# Patient Record
Sex: Male | Born: 1990 | Race: Black or African American | Hispanic: No | Marital: Single | Smoking: Never smoker
Health system: Southern US, Community
[De-identification: ages and names within clinical notes are randomized; demographics above are authoritative.]

## PROBLEM LIST (undated history)

## (undated) HISTORY — PX: OTHER SURGICAL HISTORY: SHX169

---
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
## 2012-06-06 ENCOUNTER — Encounter (HOSPITAL_COMMUNITY): Payer: Self-pay | Admitting: *Deleted

## 2012-06-06 ENCOUNTER — Emergency Department (HOSPITAL_COMMUNITY)
Admission: EM | Admit: 2012-06-06 | Discharge: 2012-06-06 | Discharge status: Absent without leave | Payer: Self-pay | Source: Home / Self Care

## 2012-06-06 ENCOUNTER — Emergency Department (HOSPITAL_COMMUNITY)
Admission: EM | Admit: 2012-06-06 | Discharge: 2012-06-06 | Disposition: A | Discharge status: Home / Self Care | Payer: Self-pay | Attending: Emergency Medicine | Admitting: Emergency Medicine

## 2012-06-06 DIAGNOSIS — R110 Nausea: Secondary | ICD-10-CM | POA: Insufficient documentation

## 2012-06-06 DIAGNOSIS — R5383 Other fatigue: Secondary | ICD-10-CM | POA: Insufficient documentation

## 2012-06-06 DIAGNOSIS — R5381 Other malaise: Secondary | ICD-10-CM | POA: Insufficient documentation

## 2012-06-06 DIAGNOSIS — R42 Dizziness and giddiness: Secondary | ICD-10-CM | POA: Insufficient documentation

## 2012-06-06 DIAGNOSIS — R197 Diarrhea, unspecified: Secondary | ICD-10-CM | POA: Insufficient documentation

## 2012-06-06 LAB — URINALYSIS, MICROSCOPIC ONLY
Leukocytes, UA: NEGATIVE
Protein, ur: 30 mg/dL — AB

## 2012-06-06 LAB — COMPREHENSIVE METABOLIC PANEL
AST: 25 U/L (ref 0–37)
Albumin: 4.1 g/dL (ref 3.5–5.2)
Alkaline Phosphatase: 102 U/L (ref 39–117)
CO2: 27 mEq/L (ref 19–32)
Calcium: 9.8 mg/dL (ref 8.4–10.5)
Creatinine, Ser: 1.01 mg/dL (ref 0.50–1.35)
Total Protein: 8.3 g/dL (ref 6.0–8.3)

## 2012-06-06 LAB — CBC WITH DIFFERENTIAL/PLATELET
Basophils Absolute: 0 10*3/uL (ref 0.0–0.1)
HCT: 45.6 % (ref 39.0–52.0)
Hemoglobin: 15.5 g/dL (ref 13.0–17.0)
Lymphs Abs: 1.5 10*3/uL (ref 0.7–4.0)
MCH: 27 pg (ref 26.0–34.0)
Monocytes Relative: 8 % (ref 3–12)
Platelets: 226 10*3/uL (ref 150–400)
RBC: 5.75 MIL/uL (ref 4.22–5.81)
RDW: 14.2 % (ref 11.5–15.5)

## 2012-06-06 MED ORDER — SODIUM CHLORIDE 0.9 % IV BOLUS (SEPSIS)
1000.0000 mL | Freq: Once | INTRAVENOUS | Status: AC
  Administered 2012-06-06: 1000 mL via INTRAVENOUS

## 2012-06-06 MED ORDER — DIPHENOXYLATE-ATROPINE 2.5-0.025 MG PO TABS
1.0000 | ORAL_TABLET | Freq: Once | ORAL | Status: AC
  Administered 2012-06-06: 1 via ORAL
  Filled 2012-06-06: qty 1

## 2012-06-06 MED ORDER — DIPHENOXYLATE-ATROPINE 2.5-0.025 MG PO TABS: 1.0000 | ORAL_TABLET | Freq: Four times a day (QID) | ORAL | Status: DC | PRN

## 2012-06-06 MED ORDER — ONDANSETRON HCL 4 MG PO TABS: 4.0000 mg | ORAL_TABLET | Freq: Four times a day (QID) | ORAL | Status: DC

## 2012-06-06 MED ORDER — DICYCLOMINE HCL 20 MG PO TABS: 20.0000 mg | ORAL_TABLET | Freq: Two times a day (BID) | ORAL | Status: DC

## 2012-06-06 MED ORDER — SODIUM CHLORIDE 0.9 % IV SOLN
Freq: Once | INTRAVENOUS | Status: AC
  Administered 2012-06-06: 15:00:00 via INTRAVENOUS

## 2012-06-06 NOTE — ED Notes (Signed)
Reports lower abd pain with diarrhea since Tuesday. Denies urinary symptoms or vomiting. No acute distress noted at triage.

## 2012-06-06 NOTE — ED Provider Notes (Signed)
Medical screening examination/treatment/procedure(s) were performed by non-physician practitioner and as supervising physician I was immediately available for consultation/collaboration.  Ethelda Chick, MD 06/06/12 Ernestina [place]

## 2012-06-06 NOTE — ED Provider Notes (Signed)
History     CSN: [id number]  Arrival date & time 06/06/12  1222   First MD Initiated Contact with Patient 06/06/12 1428      Chief Complaint  Patient presents with  . Abdominal Pain    (Consider location/radiation/quality/duration/timing/severity/associated sxs/prior treatment) Patient is a 21 y.o. male presenting with abdominal pain. The history is provided by the patient. No language interpreter was used.  Abdominal Pain The primary symptoms of the illness include abdominal pain, fatigue, nausea and diarrhea. The primary symptoms of the illness do not include fever, shortness of breath, vomiting or dysuria. The current episode started more than 2 days ago. The onset of the illness was gradual.  The patient has had a change in bowel habit. Symptoms associated with the illness do not include diaphoresis, constipation or back pain.   21 yo male with c/o diarrhea x 4-5 days.  15 times in the last 12 hours.  Dizzy upon standing x 1 last pm.  No antibiotics in the last 3 months.  Diffuse abdominal pain.  Will hydrate in the ER and give lomotil.  Denies fever, n/v.  Able to eat and drink.  States he felt better last night after he drank a large container of water.     History reviewed. No pertinent past medical history.  History reviewed. No pertinent past surgical history.  History reviewed. No pertinent family history.  History  Substance Use Topics  . Smoking status: Never Smoker   . Smokeless tobacco: Not on file  . Alcohol Use: Yes     Comment: occ      Review of Systems  Constitutional: Positive for fatigue. Negative for fever and diaphoresis.  HENT: Negative.   Eyes: Negative.   Respiratory: Negative.  Negative for shortness of breath.   Cardiovascular: Negative.   Gastrointestinal: Positive for nausea, abdominal pain and diarrhea. Negative for vomiting and constipation.  Genitourinary: Negative for dysuria.  Musculoskeletal: Negative for back pain.  Neurological:  Positive for light-headedness. Negative for dizziness, facial asymmetry and headaches.  Psychiatric/Behavioral: Negative.   All other systems reviewed and are negative.    Allergies  Review of patient's allergies indicates no known allergies.  Home Medications   Current Outpatient Rx  Name  Route  Sig  Dispense  Refill  . AMPHETAMINE-DEXTROAMPHET ER 10 MG PO CP24   Oral   Take 2 capsules (20 mg total) by mouth every morning. Fill now   60 capsule   0   . AMPHETAMINE-DEXTROAMPHET ER 10 MG PO CP24   Oral   Take 2 capsules (20 mg total) by mouth daily. Fill in one month   60 capsule   0   . AMPHETAMINE-DEXTROAMPHET ER 10 MG PO CP24   Oral   Take 2 capsules (20 mg total) by mouth daily. Fill in two months   60 capsule   0   . LIDOCAINE VISCOUS 2 % MT SOLN      Take 1 tablespoon, gargle and swish in back of throat, then swallow or spit out.  Do this no more than every 3 hours.          Marland Kitchen PROMETHAZINE HCL 25 MG PO TABS   Oral   Take 1 tablet (25 mg total) by mouth every 6 (six) hours as needed for nausea.   20 tablet   0   . PROMETHAZINE HCL 25 MG/ML IJ SOLN   Intramuscular   Inject 1 mL (25 mg total) into the muscle every 6 (six) hours  as needed.   1 mL   0     BP 122/72  Pulse 91  Temp 98.5 F (36.9 C) (Oral)  Resp 18  SpO2 97%  Physical Exam  Nursing note and vitals reviewed. Constitutional: He is oriented to person, place, and time. He appears well-developed and well-nourished.  HENT:  Head: Normocephalic.  Eyes: Conjunctivae normal and EOM are normal. Pupils are equal, round, and reactive to light.  Neck: Normal range of motion. Neck supple.  Cardiovascular: Normal rate.   Pulmonary/Chest: Effort normal.  Abdominal: Soft. Bowel sounds are normal. He exhibits no distension. There is tenderness.       Diffuse tendernss   Musculoskeletal: Normal range of motion.  Neurological: He is alert and oriented to person, place, and time.  Skin: Skin is warm  and dry.  Psychiatric: He has a normal mood and affect.    ED Course  Procedures (including critical care time)  Labs Reviewed  COMPREHENSIVE METABOLIC PANEL - Abnormal; Notable for the following:    Potassium 3.4 (*)     Total Bilirubin 0.2 (*)     All other components within normal limits  CBC WITH DIFFERENTIAL  LIPASE, BLOOD  URINALYSIS, MICROSCOPIC ONLY   No results found.   No diagnosis found.    MDM  3pm  Report given to Tiffany PA who will disposition patient after 3 liters of NS and lomotil  Patient can follow up at the [facility] clinic next week.  He will be discharge with lomotil and zofran. Non toxic appearance. Dehydration and diarrhea. + guiac will need a gi referral.   Labs Reviewed  COMPREHENSIVE METABOLIC PANEL - Abnormal; Notable for the following:    Potassium 3.4 (*)     Total Bilirubin 0.2 (*)     All other components within normal limits  URINALYSIS, MICROSCOPIC ONLY - Abnormal; Notable for the following:    Color, Urine AMBER (*)  BIOCHEMICALS MAY BE AFFECTED BY COLOR   APPearance CLOUDY (*)     Specific Gravity, Urine 1.043 (*)     Hgb urine dipstick TRACE (*)     Ketones, ur 15 (*)     Protein, ur 30 (*)     All other components within normal limits  CBC WITH DIFFERENTIAL  LIPASE, BLOOD  OCCULT BLOOD, POC DEVICE          Remi Haggard, NP 06/06/12 1624  Remi Haggard, NP 06/06/12 1625

## 2012-06-06 NOTE — ED Provider Notes (Signed)
Medical screening examination/treatment/procedure(s) were performed by non-physician practitioner and as supervising physician I was immediately available for consultation/collaboration.  Ethelda Chick, MD 06/06/12 1728

## 2012-06-06 NOTE — ED Provider Notes (Signed)
Patient handed off to me from Remi Haggard nurse practitioner. Patient is being hydrated for his diarrhea. She came to the ER for lower abdominal cramping, diarrhea and dehydration.  Patient has not had any diarrhea or vomiting in the ER for the past 3 hours. He is on 2 L of normal saline as his labs show [place] that he was dehydrated. He is otherwise healthy and does not feel weak at this time. He has a primary care doctor with he plans to followup with. The patient had a positive guaiac which is most likely from irritation of the intestines however I will give him a GI followup. I have recommended that he see his PCP first of the week and have a stool retested for blood since the bleeding is most likely due to his current problem. The mom and the patient both voiced understanding and are agreeable with the plan. All discharge with Zofran, Lomotil, Bentyl.  Pt has been advised of the symptoms that warrant their return to the ED. Patient has voiced understanding and has agreed to follow-up with the PCP or specialist.   Pt has been advised of the symptoms that warrant their return to the ED. Patient has voiced understanding and has agreed to follow-up with the PCP or specialist.   Dorthula Matas, PA 06/06/12 [phone]

## 2013-06-26 ENCOUNTER — Encounter (HOSPITAL_COMMUNITY): Payer: Self-pay | Admitting: Emergency Medicine

## 2013-06-26 ENCOUNTER — Emergency Department (HOSPITAL_COMMUNITY): Payer: Self-pay

## 2013-06-26 ENCOUNTER — Emergency Department (HOSPITAL_COMMUNITY)
Admission: EM | Admit: 2013-06-26 | Discharge: 2013-06-26 | Disposition: A | Discharge status: Home / Self Care | Payer: Self-pay | Attending: Emergency Medicine | Admitting: Emergency Medicine

## 2013-06-26 DIAGNOSIS — S93402A Sprain of unspecified ligament of left ankle, initial encounter: Secondary | ICD-10-CM

## 2013-06-26 DIAGNOSIS — Y929 Unspecified place or not applicable: Secondary | ICD-10-CM | POA: Insufficient documentation

## 2013-06-26 DIAGNOSIS — X500XXA Overexertion from strenuous movement or load, initial encounter: Secondary | ICD-10-CM | POA: Insufficient documentation

## 2013-06-26 DIAGNOSIS — S93409A Sprain of unspecified ligament of unspecified ankle, initial encounter: Secondary | ICD-10-CM | POA: Insufficient documentation

## 2013-06-26 DIAGNOSIS — Y9367 Activity, basketball: Secondary | ICD-10-CM | POA: Insufficient documentation

## 2013-06-26 DIAGNOSIS — Z79899 Other long term (current) drug therapy: Secondary | ICD-10-CM | POA: Insufficient documentation

## 2013-06-26 MED ORDER — OXYCODONE-ACETAMINOPHEN 5-325 MG PO TABS: 1.0000 | ORAL_TABLET | Freq: Four times a day (QID) | ORAL | Status: DC | PRN

## 2013-06-26 MED ORDER — IBUPROFEN 600 MG PO TABS
600.0000 mg | ORAL_TABLET | Freq: Four times a day (QID) | ORAL | Status: DC | PRN
Start: 2013-06-26 — End: 2015-09-17

## 2013-06-26 NOTE — ED Notes (Signed)
Patient transported to X-ray 

## 2013-06-26 NOTE — ED Provider Notes (Signed)
CSN: [id number]     Arrival date & time 06/26/13  2010 History   First MD Initiated Contact with Patient 06/26/13 2054     Chief Complaint  Patient presents with  . Ankle Pain   (Consider location/radiation/quality/duration/timing/severity/associated sxs/prior Treatment) Patient is a 22 y.o. male presenting with ankle pain. The history is provided by the patient. No language interpreter was used.  Ankle Pain Location:  Ankle Injury: yes   Mechanism of injury comment:  Rolled ankle while playing basketball Ankle location:  L ankle Pain details:    Quality:  Throbbing   Radiates to:  Does not radiate   Severity:  Moderate   Onset quality:  Gradual   Duration:  7 hours   Timing:  Constant   Progression:  Waxing and waning Chronicity:  New Dislocation: no   Prior injury to area:  No Relieved by:  NSAIDs and ice Worsened by:  Bearing weight (movement and palpation) Associated symptoms: decreased ROM (Secondary to pain) and swelling   Associated symptoms: no fever, no muscle weakness, no numbness and no tingling     History reviewed. No pertinent past medical history. History reviewed. No pertinent past surgical history. No family history on file. History  Substance Use Topics  . Smoking status: Never Smoker   . Smokeless tobacco: Not on file  . Alcohol Use: Yes     Comment: occ    Review of Systems  Constitutional: Negative for fever.  Musculoskeletal: Positive for arthralgias, joint swelling and myalgias.  Skin: Negative for pallor.  Neurological: Negative for weakness and numbness.  All other systems reviewed and are negative.    Allergies  Review of patient's allergies indicates no known allergies.  Home Medications   Current Outpatient Rx  Name  Route  Sig  Dispense  Refill  . dicyclomine (BENTYL) 20 MG tablet   Oral   Take 1 tablet (20 mg total) by mouth 2 (two) times daily.   20 tablet   0   . diphenoxylate-atropine (LOMOTIL) 2.5-0.025 MG per tablet   Oral   Take 1 tablet by mouth 4 (four) times daily as needed for diarrhea or loose stools.   30 tablet   0   . ondansetron (ZOFRAN) 4 MG tablet   Oral   Take 1 tablet (4 mg total) by mouth every 6 (six) hours.   12 tablet   0   . ibuprofen (ADVIL,MOTRIN) 600 MG tablet   Oral   Take 1 tablet (600 mg total) by mouth every 6 (six) hours as needed.   30 tablet   0   . oxyCODONE-acetaminophen (PERCOCET/ROXICET) 5-325 MG per tablet   Oral   Take 1 tablet by mouth every 6 (six) hours as needed for severe pain.   5 tablet   0   . EXPIRED: promethazine (PHENERGAN) 25 MG tablet   Oral   Take 1 tablet (25 mg total) by mouth every 6 (six) hours as needed for nausea.   20 tablet   0    BP 128/71  Pulse 71  Temp(Src) 97.5 F (36.4 C) (Oral)  SpO2 99%  Physical Exam  Nursing note and vitals reviewed. Constitutional: He appears well-developed and well-nourished.  HENT:  Head: Normocephalic and atraumatic.  Eyes: Conjunctivae are normal. No scleral icterus.  Cardiovascular: Normal rate, regular rhythm and intact distal pulses.   DP and PT pulses 2+ bilaterally  Pulmonary/Chest: Effort normal. No respiratory distress.  Musculoskeletal: He exhibits tenderness.  Left ankle: He exhibits decreased range of motion (Secondary to pain) and swelling. He exhibits no deformity and normal pulse. Tenderness. Lateral malleolus and medial malleolus tenderness found. Achilles tendon normal.       Feet:  Neurological: He is alert.  No numbness, tingling or weakness of the affected extremity  Skin: Skin is warm and dry.  Psychiatric: He has a normal mood and affect. His behavior is normal.    ED Course  Procedures (including critical care time) Labs Review Labs Reviewed - No data to display  Imaging Review Dg Ankle Complete Left  06/26/2013   CLINICAL DATA:  Ankle pain.  EXAM: LEFT ANKLE COMPLETE - 3+ VIEW  COMPARISON:  None.  FINDINGS: Soft tissue swelling around the ankle,  especially laterally. No acute fracture or malalignment. No joint narrowing.  IMPRESSION: Negative for acute osseous injury.   Electronically Signed   By: Tiburcio Pea M.D.   On: 06/26/2013 21:43    EKG Interpretation   None       MDM   1. Ankle sprain, left, initial encounter    Uncomplicated ankle sprain secondary to patient rolling his ankle while playing basketball this afternoon. Patient neurovascularly intact. Good perfusion and left lower extremity and foot. No gross sensory deficits appreciated. No evidence of septic joint. No crepitus or effusions. X-ray negative for acute osseous injury. ASO ankle applied in ED and patient given crutches for weightbearing as tolerated. Ibuprofen for pain control and RICE advised. Return precautions discussed and patient agreeable to plan with no unaddressed concerns.   Filed Vitals:   06/26/13 2015  BP: 128/71  Pulse: 71  Temp: 97.5 F (36.4 C)  TempSrc: Oral  SpO2: 99%      Antony Madura, PA-C 06/26/13 2201

## 2013-06-26 NOTE — Progress Notes (Signed)
Orthopedic Tech Progress Note Patient Details:  Isaiah Thompson 10/23/1990 [id number]  Ortho Devices Type of Ortho Device: ASO;Crutches Ortho Device/Splint Location: lle Ortho Device/Splint Interventions: Application   Nikki Dom 06/26/2013, 10:28 PM

## 2013-06-26 NOTE — ED Notes (Signed)
Pt. rports pain at left ankle with swelling injured this evening while playing basketball .

## 2013-06-26 NOTE — ED Provider Notes (Signed)
Medical screening examination/treatment/procedure(s) were performed by non-physician practitioner and as supervising physician I was immediately available for consultation/collaboration.  EKG Interpretation   None        Doug Sou, MD 06/26/13 [phone]

## 2015-09-17 ENCOUNTER — Emergency Department (HOSPITAL_COMMUNITY): Payer: Managed Care, Other (non HMO)

## 2015-09-17 ENCOUNTER — Encounter (HOSPITAL_COMMUNITY): Payer: Self-pay | Admitting: Emergency Medicine

## 2015-09-17 ENCOUNTER — Emergency Department (HOSPITAL_COMMUNITY)
Admission: EM | Admit: 2015-09-17 | Discharge: 2015-09-17 | Disposition: A | Discharge status: Home / Self Care | Payer: Managed Care, Other (non HMO) | Attending: Emergency Medicine | Admitting: Emergency Medicine

## 2015-09-17 DIAGNOSIS — S025XXA Fracture of tooth (traumatic), initial encounter for closed fracture: Secondary | ICD-10-CM | POA: Insufficient documentation

## 2015-09-17 DIAGNOSIS — Y998 Other external cause status: Secondary | ICD-10-CM | POA: Diagnosis not present

## 2015-09-17 DIAGNOSIS — S01511A Laceration without foreign body of lip, initial encounter: Secondary | ICD-10-CM | POA: Insufficient documentation

## 2015-09-17 DIAGNOSIS — Y92410 Unspecified street and highway as the place of occurrence of the external cause: Secondary | ICD-10-CM | POA: Diagnosis not present

## 2015-09-17 DIAGNOSIS — S0993XA Unspecified injury of face, initial encounter: Secondary | ICD-10-CM | POA: Diagnosis present

## 2015-09-17 DIAGNOSIS — S4992XA Unspecified injury of left shoulder and upper arm, initial encounter: Secondary | ICD-10-CM | POA: Insufficient documentation

## 2015-09-17 DIAGNOSIS — Y9389 Activity, other specified: Secondary | ICD-10-CM | POA: Diagnosis not present

## 2015-09-17 DIAGNOSIS — Z23 Encounter for immunization: Secondary | ICD-10-CM | POA: Insufficient documentation

## 2015-09-17 DIAGNOSIS — S50312A Abrasion of left elbow, initial encounter: Secondary | ICD-10-CM | POA: Diagnosis not present

## 2015-09-17 MED ORDER — HYDROCODONE-ACETAMINOPHEN 5-325 MG PO TABS
2.0000 | ORAL_TABLET | Freq: Once | ORAL | Status: AC
  Administered 2015-09-17: 2 via ORAL
  Filled 2015-09-17: qty 2

## 2015-09-17 MED ORDER — TETANUS-DIPHTH-ACELL PERTUSSIS 5-2.5-18.5 LF-MCG/0.5 IM SUSP
0.5000 mL | Freq: Once | INTRAMUSCULAR | Status: AC
  Administered 2015-09-17: 0.5 mL via INTRAMUSCULAR
  Filled 2015-09-17: qty 0.5

## 2015-09-17 MED ORDER — DIAZEPAM 5 MG PO TABS
5.0000 mg | ORAL_TABLET | Freq: Once | ORAL | Status: AC
  Administered 2015-09-17: 5 mg via ORAL
  Filled 2015-09-17: qty 1

## 2015-09-17 MED ORDER — IBUPROFEN 400 MG PO TABS: 400.0000 mg | ORAL_TABLET | Freq: Four times a day (QID) | ORAL | Status: DC | PRN

## 2015-09-17 MED ORDER — HYDROCODONE-ACETAMINOPHEN 5-325 MG PO TABS: 1.0000 | ORAL_TABLET | ORAL | Status: DC | PRN

## 2015-09-17 MED ORDER — LIDOCAINE HCL (PF) 1 % IJ SOLN
5.0000 mL | Freq: Once | INTRAMUSCULAR | Status: AC
  Administered 2015-09-17: 5 mL
  Filled 2015-09-17: qty 5
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
## 2016-07-29 ENCOUNTER — Encounter (HOSPITAL_COMMUNITY): Payer: Self-pay | Admitting: Emergency Medicine

## 2016-07-29 ENCOUNTER — Emergency Department (HOSPITAL_COMMUNITY): Payer: BLUE CROSS/BLUE SHIELD

## 2016-07-29 ENCOUNTER — Emergency Department (HOSPITAL_COMMUNITY)
Admission: EM | Admit: 2016-07-29 | Discharge: 2016-07-29 | Disposition: A | Discharge status: Home / Self Care | Payer: BLUE CROSS/BLUE SHIELD | Attending: Emergency Medicine | Admitting: Emergency Medicine

## 2016-07-29 DIAGNOSIS — S92425A Nondisplaced fracture of distal phalanx of left great toe, initial encounter for closed fracture: Secondary | ICD-10-CM | POA: Insufficient documentation

## 2016-07-29 DIAGNOSIS — Y939 Activity, unspecified: Secondary | ICD-10-CM | POA: Diagnosis not present

## 2016-07-29 DIAGNOSIS — Y990 Civilian activity done for income or pay: Secondary | ICD-10-CM | POA: Insufficient documentation

## 2016-07-29 DIAGNOSIS — S99922A Unspecified injury of left foot, initial encounter: Secondary | ICD-10-CM | POA: Diagnosis present

## 2016-07-29 DIAGNOSIS — F909 Attention-deficit hyperactivity disorder, unspecified type: Secondary | ICD-10-CM | POA: Diagnosis not present

## 2016-07-29 DIAGNOSIS — Y9289 Other specified places as the place of occurrence of the external cause: Secondary | ICD-10-CM | POA: Diagnosis not present

## 2016-07-29 DIAGNOSIS — Z79899 Other long term (current) drug therapy: Secondary | ICD-10-CM | POA: Diagnosis not present

## 2016-07-29 DIAGNOSIS — W208XXA Other cause of strike by thrown, projected or falling object, initial encounter: Secondary | ICD-10-CM | POA: Insufficient documentation

## 2016-07-29 MED ORDER — OXYCODONE-ACETAMINOPHEN 5-325 MG PO TABS: 2.0000 | ORAL_TABLET | ORAL | 0 refills | Status: DC | PRN

## 2016-07-29 MED ORDER — HYDROCODONE-ACETAMINOPHEN 5-325 MG PO TABS
2.0000 | ORAL_TABLET | Freq: Once | ORAL | Status: AC
  Administered 2016-07-29: 2 via ORAL
  Filled 2016-07-29: qty 2
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
## 2016-07-29 NOTE — Discharge Instructions (Signed)
You have an oblique fracture at the end of your left big toe.  This fracture is considered stable, however because it is your big toe you need to follow up with an orthopedist in the next 3-5 days to ensure it is healing well.  Your big toe provides balance and helps you walk normally, if this fracture does not heal well you may have long term problems that can affect the way you walk.  Please keep your splint and wear your post-op shoe until you are no longer tender in the area of the injury and it does not hurt to walk on it.  Usually this takes 4-6 weeks.  Place ice and elevate your foot.  Please take percocet for severe pain, you can reserve this for night time pain so you are able to sleep better. Take ibuprofen or tylenol for pain throughout the day.

## 2016-07-29 NOTE — ED Provider Notes (Signed)
[facility] Provider Note   CSN: [id number] Arrival date & time: 07/29/16  1910     History   Chief Complaint Chief Complaint  Patient presents with  . Toe Injury    HPI Isaiah Thompson is a 26 y.o. male with no pertinent past medical history presents to the emergency department with left great toe pain, swelling that occurred after a work place accident 2 days ago.  Patient states that he was rearranging food in the freezer when one keg of beer fell directly on his left great toe. Patient states he had immediate pain, became angry and kicked the shelf which caused a second keg of beer to fall directly on his great toe. Patient tried to rest and take ibuprofen for the pain however the pain has become intolerable and left great toe is now swollen. Patient also reports the nail has a dark color underneath it. Patient has been walking but with an antalgic gait due to the pain on his left great toe. No previous injury to the left great toe or left lower extremity. Patient is a right-handed cook.  HPI  History reviewed. No pertinent past medical history.  Patient Active Problem List   Diagnosis Date Noted  . Gastroenteritis, acute 05/10/2011  . Acne 02/19/2011  . Sports physical 02/19/2011  . CONCUSSION 09/07/2009  . ATTENTION DEFICIT, W/HYPERACTIVITY 09/04/2006    Past Surgical History:  Procedure Laterality Date  . extraction of wisdom teeth         Home Medications    Prior to Admission medications   Medication Sig Start Date End Date Taking? Authorizing Provider  Artificial Tear Ointment (DRY EYES OP) Apply 1 drop to eye daily as needed (dry eyes).    Historical Provider, MD  HYDROcodone-acetaminophen (NORCO/VICODIN) 5-325 MG tablet Take 1-2 tablets by mouth every 4 (four) hours as needed. 09/17/15   Joycie Peek, PA-C  ibuprofen (ADVIL,MOTRIN) 400 MG tablet Take 1 tablet (400 mg total) by mouth every 6 (six) hours as needed. 09/17/15   Joycie Peek, PA-C    oxyCODONE-acetaminophen (PERCOCET/ROXICET) 5-325 MG tablet Take 2 tablets by mouth every 4 (four) hours as needed for severe pain. 07/29/16   Liberty Handy, PA-C  promethazine (PHENERGAN) 25 MG tablet Take 1 tablet (25 mg total) by mouth every 6 (six) hours as needed for nausea. 05/10/11 05/17/11  Barbaraann Barthel, MD    Family History Family History  Problem Relation Age of Onset  . Diabetes Other     Social History Social History  Substance Use Topics  . Smoking status: Never Smoker  . Smokeless tobacco: Never Used  . Alcohol use No     Allergies   Patient has no known allergies.   Review of Systems Review of Systems  Constitutional: Positive for activity change (limping due to L great toe pain). Negative for chills and fever.  HENT: Negative for congestion and sore throat.   Eyes: Negative for visual disturbance.  Respiratory: Negative for cough, chest tightness and shortness of breath.   Cardiovascular: Negative for chest pain.  Gastrointestinal: Negative for abdominal pain, constipation, diarrhea, nausea and vomiting.  Genitourinary: Negative for decreased urine volume and difficulty urinating.  Musculoskeletal: Positive for arthralgias (L great toe) and gait problem. Negative for joint swelling.  Skin: Positive for color change (swelling, rednessand nail color change of L great toe). Negative for rash.  Neurological: Negative for dizziness, light-headedness and headaches.     Physical Exam Updated Vital Signs BP 138/67 (BP Location:  Right Arm)   Pulse 70   Temp 98.3 F (36.8 C) (Oral)   Resp 16   Ht 6\' 1"  (1.854 m)   Wt 91.2 kg   SpO2 100%   BMI 26.52 kg/m   Physical Exam  Constitutional: He is oriented to person, place, and time. He appears well-developed and well-nourished. No distress.  NAD.  HENT:  Head: Normocephalic and atraumatic.  Right Ear: External ear normal.  Left Ear: External ear normal.  Nose: Nose normal.  Mouth/Throat: Oropharynx is  clear and moist. No oropharyngeal exudate.  Moist mucous membranes.  No nasal mucosa edema.  Eyes: Conjunctivae and EOM are normal. Pupils are equal, round, and reactive to light. No scleral icterus.  Neck: Normal range of motion. Neck supple. No JVD present. No tracheal deviation present.  Cardiovascular: Normal rate, regular rhythm, normal heart sounds and intact distal pulses.   No murmur heard. Pulmonary/Chest: Effort normal and breath sounds normal. He has no wheezes.  Abdominal: Soft. He exhibits no distension. There is no tenderness.  Musculoskeletal:  L great toe mild edema and tenderness. No L MTP tenderness. Decreased ROM of L great toe secondary to pain.  Normal ROM of L ankle and L knee.   Lymphadenopathy:    He has no cervical adenopathy.  Neurological: He is alert and oriented to person, place, and time.  Sensation to light touch intact in L foot. 5/5 strength at L ankle and L toes.   Skin: Skin is warm and dry. Capillary refill takes less than 2 seconds.  Mild edema around distal L great toe. Less than 50% of L great toe nail has darker coloring underneath it.   Psychiatric: He has a normal mood and affect. His behavior is normal. Judgment and thought content normal.  Nursing note and vitals reviewed.    ED Treatments / Results  Labs (all labs ordered are listed, but only abnormal results are displayed) Labs Reviewed - No data to display  EKG  EKG Interpretation None       Radiology Dg Toe Great Left  Result Date: 07/29/2016 CLINICAL DATA:  Crush injury to great toe. EXAM: LEFT GREAT TOE COMPARISON:  No comparison studies available. FINDINGS: Three-view exam shows a oblique fracture involving the tuft of the distal phalanx. IMPRESSION: Oblique fracture at the tuft of the distal phalanx. Electronically Signed   By: Kennith Center M.D.   On: 07/29/2016 20:55    Procedures Procedures (including critical care time)  Medications Ordered in ED Medications    HYDROcodone-acetaminophen (NORCO/VICODIN) 5-325 MG per tablet 2 tablet (2 tablets Oral Given 07/29/16 2122)     Initial Impression / Assessment and Plan / ED Course  I have reviewed the triage vital signs and the nursing notes.  Pertinent labs & imaging results that were available during my care of the patient were reviewed by me and considered in my medical decision making (see chart for details).  Clinical Course as of Jul 29 2250  Mon Jul 29, 2016  2110 Oblique fracture at the tuft of the distal phalanx. DG Toe Great Left [CG]    Clinical Course User Index [CG] Liberty Handy, PA-C    Pt with a traumatic oblique fracture at the distal aspect of first phalanx of L great toe which occurred at work place two days ago.  No dislocation, no displacement, no NV compromise.  There is a tiny subungual hematoma <50%.  Drainage not indicated at this time due to small size and given  the fact that it has been >24 hours since incident. Patient will be discharged with analgesics, buddy tape of L great toe and post op shoe for the next 4-6 weeks.  Pt advised to f/u with orthopedist in the next 3-5 days for re-evaluation.  Pt verbalized understanding and is agreeable with plan.  Final Clinical Impressions(s) / ED Diagnoses   Final diagnoses:  Closed nondisplaced fracture of distal phalanx of left great toe, initial encounter    New Prescriptions New Prescriptions   OXYCODONE-ACETAMINOPHEN (PERCOCET/ROXICET) 5-325 MG TABLET    Take 2 tablets by mouth every 4 (four) hours as needed for severe pain.     Liberty HandyClaudia J Edgard Debord, PA-C 07/29/16 2252    Derwood KaplanAnkit Nanavati, MD 07/30/16 1529
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
## 2017-07-27 ENCOUNTER — Ambulatory Visit (HOSPITAL_COMMUNITY)
Admission: EM | Admit: 2017-07-27 | Discharge: 2017-07-27 | Disposition: A | Discharge status: Home / Self Care | Payer: Managed Care, Other (non HMO) | Attending: Internal Medicine | Admitting: Internal Medicine

## 2017-07-27 ENCOUNTER — Other Ambulatory Visit: Payer: Self-pay

## 2017-07-27 DIAGNOSIS — L03317 Cellulitis of buttock: Secondary | ICD-10-CM | POA: Diagnosis not present

## 2017-07-27 MED ORDER — CEPHALEXIN 500 MG PO CAPS: 500.0000 mg | ORAL_CAPSULE | Freq: Four times a day (QID) | ORAL | 0 refills | Status: DC

## 2017-07-27 MED ORDER — CETIRIZINE HCL 10 MG PO TABS: 10.0000 mg | ORAL_TABLET | Freq: Every day | ORAL | 0 refills | Status: DC

## 2017-07-27 MED ORDER — MELOXICAM 15 MG PO TABS: 15.0000 mg | ORAL_TABLET | Freq: Every day | ORAL | 0 refills | Status: DC

## 2017-07-27 NOTE — ED Provider Notes (Signed)
[facility]    CSN: [id number] Arrival date & time: 07/27/17  1417     History   Chief Complaint Chief Complaint  Patient presents with  . Hip Pain  . Abscess    HPI Isaiah Thompson is a 27 y.o. male.   27 year old male comes in for right hip abscess.  He is not sure how long it has been there for.  He states that it started itching as if he had a bug bite, and he has been scratching.  He noticed that it started turning painful, and area has been hard and warm to touch.  He states that the size has been increasing.  He denies fever, chills, night sweats.  He has been applying warm compress without relief.  Has not taken anything for the pain.      No past medical history on file.  Patient Active Problem List   Diagnosis Date Noted  . Gastroenteritis, acute 05/10/2011  . Acne 02/19/2011  . Sports physical 02/19/2011  . CONCUSSION 09/07/2009  . ATTENTION DEFICIT, W/HYPERACTIVITY 09/04/2006    Past Surgical History:  Procedure Laterality Date  . extraction of wisdom teeth         Home Medications    Prior to Admission medications   Medication Sig Start Date End Date Taking? Authorizing Provider  Artificial Tear Ointment (DRY EYES OP) Apply 1 drop to eye daily as needed (dry eyes).    [provider]  cephALEXin (KEFLEX) 500 MG capsule Take 1 capsule (500 mg total) by mouth 4 (four) times daily. 07/27/17   Cathie HoopsYu, Zaire Levesque V, PA-C  cetirizine (ZYRTEC) 10 MG tablet Take 1 tablet (10 mg total) by mouth daily. 07/27/17   Cathie HoopsYu, Nehemias Sauceda V, PA-C  HYDROcodone-acetaminophen (NORCO/VICODIN) 5-325 MG tablet Take 1-2 tablets by mouth every 4 (four) hours as needed. 09/17/15   Cartner, Sharlet SalinaBenjamin, PA-C  ibuprofen (ADVIL,MOTRIN) 400 MG tablet Take 1 tablet (400 mg total) by mouth every 6 (six) hours as needed. 09/17/15   Cartner, Sharlet SalinaBenjamin, PA-C  meloxicam (MOBIC) 15 MG tablet Take 1 tablet (15 mg total) by mouth daily. 07/27/17   Cathie HoopsYu, Caliph Borowiak V, PA-C  oxyCODONE-acetaminophen  (PERCOCET/ROXICET) 5-325 MG tablet Take 2 tablets by mouth every 4 (four) hours as needed for severe pain. 07/29/16   Liberty HandyGibbons, Claudia J, PA-C  promethazine (PHENERGAN) 25 MG tablet Take 1 tablet (25 mg total) by mouth every 6 (six) hours as needed for nausea. 05/10/11 05/17/11  Barbaraann BarthelBreen, James O, MD    Family History Family History  Problem Relation Age of Onset  . Diabetes Other     Social History Social History   Tobacco Use  . Smoking status: Never Smoker  . Smokeless tobacco: Never Used  Substance Use Topics  . Alcohol use: No  . Drug use: Yes    Types: Marijuana     Allergies   Patient has no known allergies.   Review of Systems Review of Systems  Reason unable to perform ROS: See HPI as above.     Physical Exam Triage Vital Signs ED Triage Vitals  Enc Vitals Group     BP 07/27/17 1543 (!) 122/52     Pulse Rate 07/27/17 1543 62     Resp --      Temp 07/27/17 1543 97.8 F (36.6 C)     Temp src --      SpO2 07/27/17 1543 99 %     Weight --      Height --  Head Circumference --      Peak Flow --      Pain Score 07/27/17 1541 8     Pain Loc --      Pain Edu? --      Excl. in GC? --    No data found.  Updated Vital Signs BP (!) 122/52 (BP Location: Left Arm)   Pulse 62   Temp 97.8 F (36.6 C)   SpO2 99%   Physical Exam  Constitutional: He is oriented to person, place, and time. He appears well-developed and well-nourished. No distress.  HENT:  Head: Normocephalic and atraumatic.  Eyes: Conjunctivae are normal. Pupils are equal, round, and reactive to light.  Neurological: He is alert and oriented to person, place, and time.  Skin:  8 cm x 9 cm round cellulitis on the right hip.  Erythema with increased warmth.  Tenderness to palpation, without fluctuance.  Patient has multiple other areas with erythema, without increased warmth or fluctuance.  All areas with small wound that could correspond to bug bite.     UC Treatments / Results  Labs (all  labs ordered are listed, but only abnormal results are displayed) Labs Reviewed - No data to display  EKG  EKG Interpretation None       Radiology No results found.  Procedures Procedures (including critical care time)  Medications Ordered in UC Medications - No data to display   Initial Impression / Assessment and Plan / UC Course  I have reviewed the triage vital signs and the nursing notes.  Pertinent labs & imaging results that were available during my care of the patient were reviewed by me and considered in my medical decision making (see chart for details).    27 year old male with possible bug bite to the right hip leading to cellulitis.  He also has other bug bite marks that is erythematous without signs of cellulitis, and continues to be pruritic.  Start Keflex for cellulitis.  Zyrtec for pruritus.  Mobic for pain.  Patient to monitor at home for possible sources of bug bites.  Return precautions given.  Patient expresses understanding and agrees to plan.  Final Clinical Impressions(s) / UC Diagnoses   Final diagnoses:  Cellulitis of buttock    ED Discharge Orders        Ordered    cephALEXin (KEFLEX) 500 MG capsule  4 times daily,   Status:  Discontinued     07/27/17 1700    meloxicam (MOBIC) 15 MG tablet  Daily,   Status:  Discontinued     07/27/17 1700    cetirizine (ZYRTEC) 10 MG tablet  Daily,   Status:  Discontinued     07/27/17 1700    cephALEXin (KEFLEX) 500 MG capsule  4 times daily     07/27/17 1706    cetirizine (ZYRTEC) 10 MG tablet  Daily     07/27/17 1706    meloxicam (MOBIC) 15 MG tablet  Daily     07/27/17 1706        Belinda Fisher, PA-C 07/27/17 1710

## 2017-07-27 NOTE — Discharge Instructions (Signed)
No signs of drainable abscess right now.  You do have a skin infection on the right hip.  Start Keflex as directed.  Continue warm compress.  Zyrtec for itchiness.  Mobic for pain.  Please monitor at home for any changes that could cause big bites, new exposures.  Monitor for worsening of symptoms, spreading redness, increased warmth, fever, follow-up for reevaluation.

## 2017-07-27 NOTE — ED Triage Notes (Signed)
Right hip pain, abscess on right hip

## 2018-02-11 IMAGING — CR DG TOE GREAT 2+V*L*
3 series · 3 of 3 positions shown · non-contrast
Comparison: No comparison studies available.

CLINICAL DATA: Crush injury to great toe.

EXAM:
LEFT GREAT TOE

[x toes ap left]
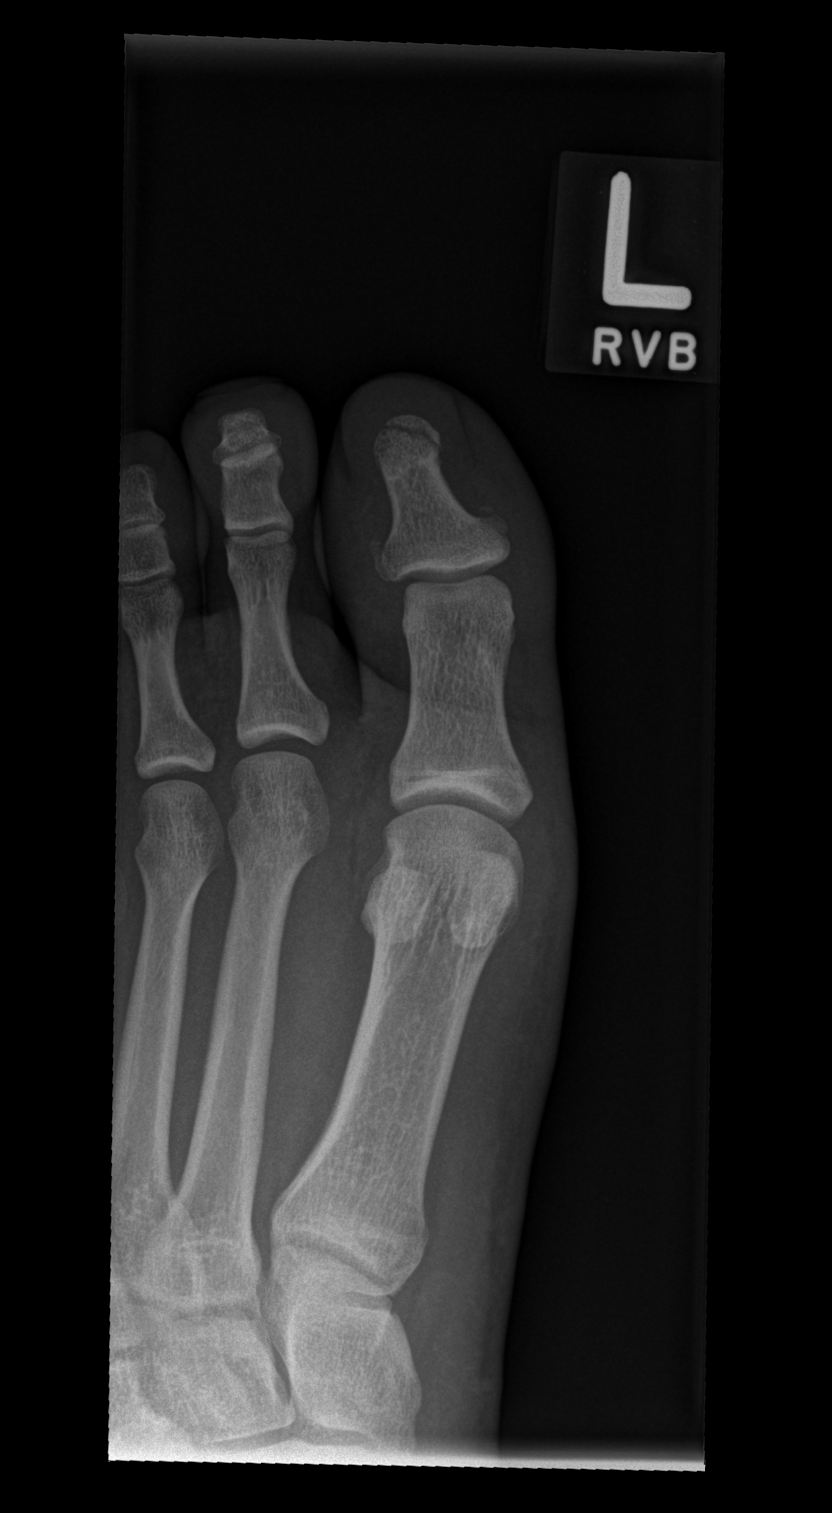

[x toes obl left]
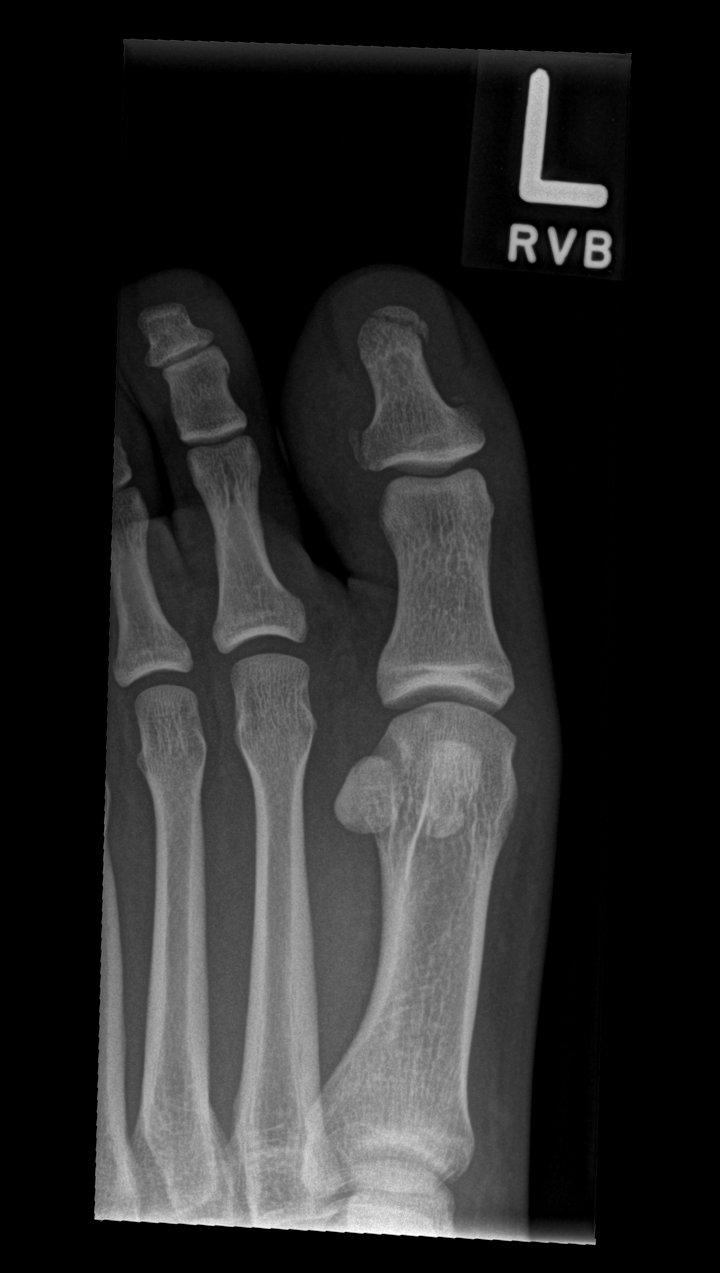

[x toes lat left]
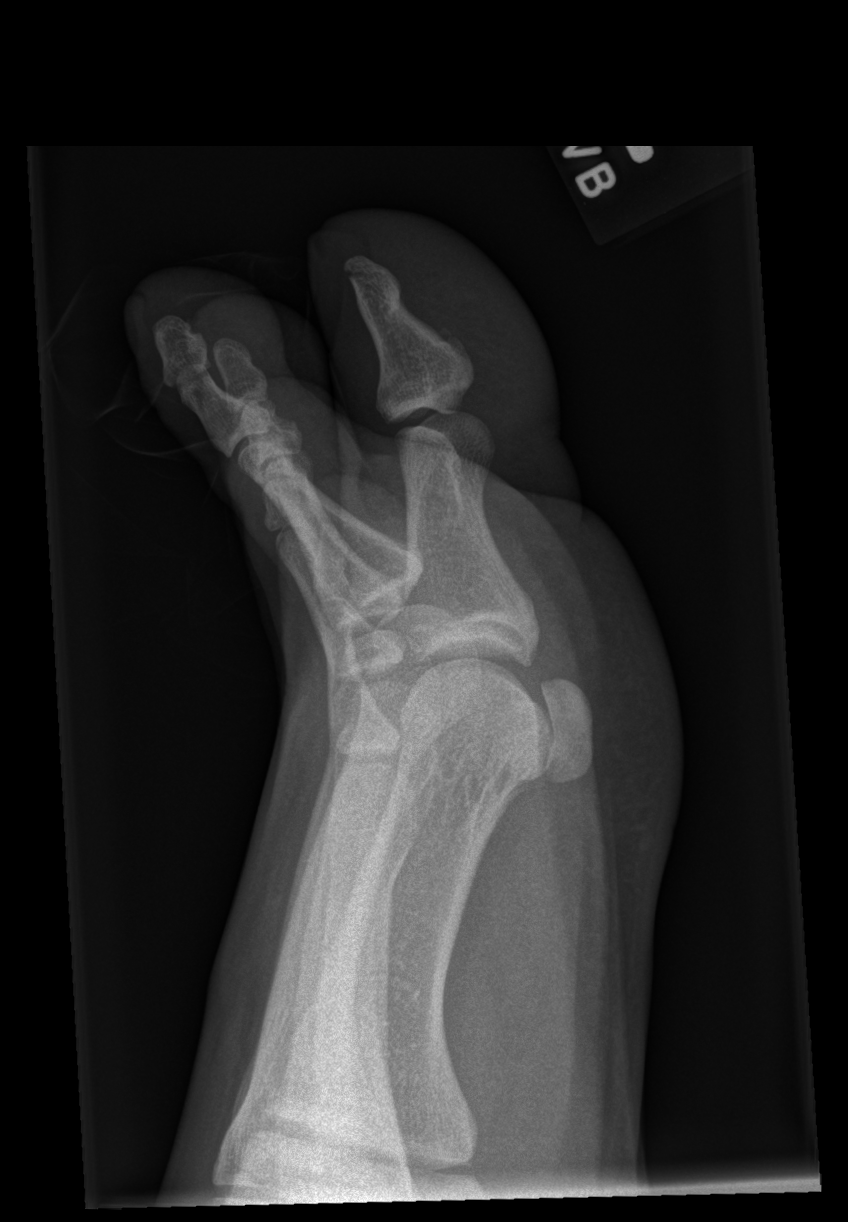

[3 of 3 positions shown; findings below may reference images not displayed]

FINDINGS: Three-view exam shows a oblique fracture involving the tuft of the
distal phalanx.
IMPRESSION: Oblique fracture at the tuft of the distal phalanx.

## 2019-03-03 ENCOUNTER — Other Ambulatory Visit: Payer: Self-pay

## 2019-03-03 ENCOUNTER — Ambulatory Visit (HOSPITAL_COMMUNITY)
Admission: EM | Admit: 2019-03-03 | Discharge: 2019-03-03 | Disposition: A | Discharge status: Home / Self Care | Payer: Managed Care, Other (non HMO) | Attending: Urgent Care | Admitting: Urgent Care

## 2019-03-03 ENCOUNTER — Ambulatory Visit (INDEPENDENT_AMBULATORY_CARE_PROVIDER_SITE_OTHER): Payer: Managed Care, Other (non HMO)

## 2019-03-03 ENCOUNTER — Encounter (HOSPITAL_COMMUNITY): Payer: Self-pay | Admitting: Emergency Medicine

## 2019-03-03 DIAGNOSIS — M545 Low back pain, unspecified: Secondary | ICD-10-CM

## 2019-03-03 DIAGNOSIS — M62830 Muscle spasm of back: Secondary | ICD-10-CM | POA: Diagnosis not present

## 2019-03-03 MED ORDER — IBUPROFEN 800 MG PO TABS
800.0000 mg | ORAL_TABLET | Freq: Once | ORAL | Status: AC
  Administered 2019-03-03: 16:00:00 800 mg via ORAL

## 2019-03-03 MED ORDER — NAPROXEN 500 MG PO TABS: 500.0000 mg | ORAL_TABLET | Freq: Two times a day (BID) | ORAL | 0 refills | Status: DC

## 2019-03-03 MED ORDER — CYCLOBENZAPRINE HCL 5 MG PO TABS: 5.0000 mg | ORAL_TABLET | Freq: Three times a day (TID) | ORAL | 0 refills | Status: DC | PRN

## 2019-03-03 MED ORDER — IBUPROFEN 800 MG PO TABS
ORAL_TABLET | ORAL | Status: AC
Start: 2019-03-03 — End: ?
  Filled 2019-03-03: qty 1

## 2019-03-03 NOTE — ED Triage Notes (Signed)
PT reports he woke up with severe lower back pain a few days ago. PT reports it is significantly worse. Pain 10/10
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
## 2019-12-11 ENCOUNTER — Ambulatory Visit (HOSPITAL_COMMUNITY)
Admission: EM | Admit: 2019-12-11 | Discharge: 2019-12-11 | Disposition: A | Discharge status: Home / Self Care | Payer: Managed Care, Other (non HMO) | Attending: Physician Assistant | Admitting: Physician Assistant

## 2019-12-11 ENCOUNTER — Encounter (HOSPITAL_COMMUNITY): Payer: Self-pay

## 2019-12-11 ENCOUNTER — Other Ambulatory Visit: Payer: Self-pay

## 2019-12-11 DIAGNOSIS — S39012A Strain of muscle, fascia and tendon of lower back, initial encounter: Secondary | ICD-10-CM

## 2019-12-11 MED ORDER — NAPROXEN 500 MG PO TABS: 500.0000 mg | ORAL_TABLET | Freq: Two times a day (BID) | ORAL | 0 refills | Status: AC

## 2019-12-11 MED ORDER — TIZANIDINE HCL 4 MG PO TABS: 4.0000 mg | ORAL_TABLET | Freq: Three times a day (TID) | ORAL | 0 refills | Status: AC | PRN

## 2019-12-11 NOTE — Discharge Instructions (Signed)
Take the naproxen twice a day Take the Zanaflex up to every 8 hours, this will make you sleepy so do not drive, drink alcohol or operate machinery within 8 hours of taking this.  I recommend taking this prior to bed  Given significant change in her back pain, such that it is worsening, you have numbness tingling or weakness in your lower extremities, change in ability to control your bowel or bladder you should immediately return urgent care or report to the emergency department for further evaluation
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
